# Patient Record
Sex: Female | Born: 1970 | Race: White | Hispanic: No | Smoking: Never smoker
Health system: Southern US, Community
[De-identification: ages and names within clinical notes are randomized; demographics above are authoritative.]

## PROBLEM LIST (undated history)

## (undated) DIAGNOSIS — N189 Chronic kidney disease, unspecified: Secondary | ICD-10-CM

## (undated) DIAGNOSIS — N200 Calculus of kidney: Secondary | ICD-10-CM

## (undated) DIAGNOSIS — E785 Hyperlipidemia, unspecified: Secondary | ICD-10-CM

## (undated) DIAGNOSIS — R7301 Impaired fasting glucose: Secondary | ICD-10-CM

## (undated) DIAGNOSIS — N83209 Unspecified ovarian cyst, unspecified side: Secondary | ICD-10-CM

## (undated) DIAGNOSIS — K219 Gastro-esophageal reflux disease without esophagitis: Secondary | ICD-10-CM

## (undated) DIAGNOSIS — E119 Type 2 diabetes mellitus without complications: Secondary | ICD-10-CM

## (undated) DIAGNOSIS — I10 Essential (primary) hypertension: Secondary | ICD-10-CM

## (undated) DIAGNOSIS — G43109 Migraine with aura, not intractable, without status migrainosus: Secondary | ICD-10-CM

## (undated) DIAGNOSIS — D649 Anemia, unspecified: Secondary | ICD-10-CM

## (undated) DIAGNOSIS — E079 Disorder of thyroid, unspecified: Secondary | ICD-10-CM

## (undated) HISTORY — PX: CHOLECYSTECTOMY: SHX55

## (undated) HISTORY — DX: Chronic kidney disease, unspecified: N18.9

## (undated) HISTORY — DX: Unspecified ovarian cyst, unspecified side: N83.209

## (undated) HISTORY — DX: Disorder of thyroid, unspecified: E07.9

## (undated) HISTORY — DX: Gastro-esophageal reflux disease without esophagitis: K21.9

## (undated) HISTORY — DX: Migraine with aura, not intractable, without status migrainosus: G43.109

## (undated) HISTORY — DX: Calculus of kidney: N20.0

## (undated) HISTORY — DX: Impaired fasting glucose: R73.01

## (undated) HISTORY — DX: Hyperlipidemia, unspecified: E78.5

## (undated) HISTORY — DX: Type 2 diabetes mellitus without complications: E11.9

## (undated) HISTORY — DX: Essential (primary) hypertension: I10

## (undated) HISTORY — DX: Anemia, unspecified: D64.9

---

## 1989-05-22 HISTORY — PX: MOUTH SURGERY: SHX715

## 1997-05-22 HISTORY — PX: BACK SURGERY: SHX140

## 1999-02-17 ENCOUNTER — Emergency Department (HOSPITAL_COMMUNITY): Admission: EM | Admit: 1999-02-17 | Discharge: 1999-02-17 | Payer: Self-pay | Admitting: Emergency Medicine

## 1999-03-07 ENCOUNTER — Encounter: Payer: Self-pay | Admitting: Emergency Medicine

## 1999-03-07 ENCOUNTER — Emergency Department (HOSPITAL_COMMUNITY): Admission: EM | Admit: 1999-03-07 | Discharge: 1999-03-07 | Payer: Self-pay | Admitting: Emergency Medicine

## 2000-12-27 ENCOUNTER — Encounter: Payer: Self-pay | Admitting: Orthopedic Surgery

## 2000-12-27 ENCOUNTER — Encounter: Admission: RE | Admit: 2000-12-27 | Discharge: 2000-12-27 | Payer: Self-pay | Admitting: Orthopedic Surgery

## 2001-01-10 ENCOUNTER — Encounter: Admission: RE | Admit: 2001-01-10 | Discharge: 2001-01-10 | Payer: Self-pay | Admitting: Orthopedic Surgery

## 2001-01-10 ENCOUNTER — Encounter: Payer: Self-pay | Admitting: Orthopedic Surgery

## 2001-01-24 ENCOUNTER — Encounter: Admission: RE | Admit: 2001-01-24 | Discharge: 2001-01-24 | Payer: Self-pay | Admitting: Orthopedic Surgery

## 2001-01-24 ENCOUNTER — Encounter: Payer: Self-pay | Admitting: Orthopedic Surgery

## 2003-04-13 DIAGNOSIS — K219 Gastro-esophageal reflux disease without esophagitis: Secondary | ICD-10-CM | POA: Insufficient documentation

## 2003-08-27 ENCOUNTER — Emergency Department (HOSPITAL_COMMUNITY): Admission: EM | Admit: 2003-08-27 | Discharge: 2003-08-28 | Payer: Self-pay | Admitting: Emergency Medicine

## 2003-09-30 DIAGNOSIS — G43109 Migraine with aura, not intractable, without status migrainosus: Secondary | ICD-10-CM | POA: Insufficient documentation

## 2007-06-23 ENCOUNTER — Emergency Department (HOSPITAL_COMMUNITY): Admission: EM | Admit: 2007-06-23 | Discharge: 2007-06-23 | Payer: Self-pay | Admitting: Emergency Medicine

## 2010-06-22 ENCOUNTER — Emergency Department (HOSPITAL_BASED_OUTPATIENT_CLINIC_OR_DEPARTMENT_OTHER)
Admission: EM | Admit: 2010-06-22 | Discharge: 2010-06-22 | Disposition: A | Discharge status: Home / Self Care | Payer: Federal, State, Local not specified - PPO | Attending: Emergency Medicine | Admitting: Emergency Medicine

## 2010-06-22 ENCOUNTER — Emergency Department (INDEPENDENT_AMBULATORY_CARE_PROVIDER_SITE_OTHER): Payer: Federal, State, Local not specified - PPO

## 2010-06-22 DIAGNOSIS — Z79899 Other long term (current) drug therapy: Secondary | ICD-10-CM | POA: Insufficient documentation

## 2010-06-22 DIAGNOSIS — I10 Essential (primary) hypertension: Secondary | ICD-10-CM | POA: Insufficient documentation

## 2010-06-22 DIAGNOSIS — K219 Gastro-esophageal reflux disease without esophagitis: Secondary | ICD-10-CM | POA: Insufficient documentation

## 2010-06-22 DIAGNOSIS — R109 Unspecified abdominal pain: Secondary | ICD-10-CM | POA: Insufficient documentation

## 2010-06-22 DIAGNOSIS — E039 Hypothyroidism, unspecified: Secondary | ICD-10-CM | POA: Insufficient documentation

## 2010-06-22 DIAGNOSIS — N390 Urinary tract infection, site not specified: Secondary | ICD-10-CM | POA: Insufficient documentation

## 2010-06-22 LAB — DIFFERENTIAL
Basophils Absolute: 0 10*3/uL (ref 0.0–0.1)
Basophils Relative: 0 % (ref 0–1)
Monocytes Absolute: 0.7 10*3/uL (ref 0.1–1.0)
Neutro Abs: 4.7 10*3/uL (ref 1.7–7.7)

## 2010-06-22 LAB — BASIC METABOLIC PANEL
Calcium: 9.2 mg/dL (ref 8.4–10.5)
GFR calc Af Amer: >60 mL/min (ref >60)
GFR calc non Af Amer: >60 mL/min (ref >60)
Potassium: 4 mEq/L (ref 3.5–5.1)
Sodium: 142 mEq/L (ref 135–145)

## 2010-06-22 LAB — URINALYSIS, ROUTINE W REFLEX MICROSCOPIC
Bilirubin Urine: NEGATIVE
Ketones, ur: NEGATIVE mg/dL
Nitrite: NEGATIVE
Protein, ur: NEGATIVE mg/dL
Specific Gravity, Urine: 1.029 (ref 1.005–1.030)
Urine Glucose, Fasting: NEGATIVE mg/dL
Urobilinogen, UA: 0.2 mg/dL (ref 0.0–1.0)
pH: 5.5 (ref 5.0–8.0)

## 2010-06-22 LAB — CBC
Hemoglobin: 12.4 g/dL (ref 12.0–15.0)
MCHC: 34.6 g/dL (ref 30.0–36.0)
RDW: 14.2 % (ref 11.5–15.5)
WBC: 8.6 10*3/uL (ref 4.0–10.5)

## 2010-06-22 LAB — URINE MICROSCOPIC-ADD ON

## 2011-02-20 LAB — HM PAP SMEAR

## 2012-11-12 ENCOUNTER — Other Ambulatory Visit: Payer: Self-pay | Admitting: *Deleted

## 2012-11-14 ENCOUNTER — Telehealth: Payer: Self-pay

## 2012-11-14 NOTE — Telephone Encounter (Signed)
Per Elease Hashimoto a prescription has been called in to [facility] on [address]for One Touch Ultra Blue Test Strips with 11 refills. LB

## 2012-12-10 ENCOUNTER — Telehealth: Payer: Self-pay | Admitting: *Deleted

## 2012-12-10 MED ORDER — LEVOTHYROXINE SODIUM 150 MCG PO TABS: 150.0000 ug | ORAL_TABLET | Freq: Every day | ORAL | Status: DC

## 2012-12-10 NOTE — Telephone Encounter (Signed)
Please refill her levothyroxine 150 mcg (0.15 mg) oral tablet for 30 days.  She is scheduled for labs and an office visit already. KL

## 2012-12-10 NOTE — Telephone Encounter (Signed)
This was sent to her pharmacy on [address] [facility] [address]) [facility]

## 2012-12-19 ENCOUNTER — Other Ambulatory Visit: Payer: Self-pay | Admitting: *Deleted

## 2012-12-19 DIAGNOSIS — E785 Hyperlipidemia, unspecified: Secondary | ICD-10-CM

## 2012-12-19 DIAGNOSIS — R5381 Other malaise: Secondary | ICD-10-CM

## 2012-12-19 DIAGNOSIS — E039 Hypothyroidism, unspecified: Secondary | ICD-10-CM

## 2012-12-19 DIAGNOSIS — E559 Vitamin D deficiency, unspecified: Secondary | ICD-10-CM

## 2012-12-23 ENCOUNTER — Other Ambulatory Visit: Payer: Federal, State, Local not specified - PPO

## 2012-12-23 LAB — COMPLETE METABOLIC PANEL WITH GFR
ALT: 13 U/L (ref 0–35)
AST: 16 U/L (ref 0–37)
Albumin: 4.4 g/dL (ref 3.5–5.2)
Alkaline Phosphatase: 64 U/L (ref 39–117)
BUN: 19 mg/dL (ref 6–23)
CO2: 26 mEq/L (ref 19–32)
Calcium: 9.3 mg/dL (ref 8.4–10.5)
Chloride: 104 mEq/L (ref 96–112)
Creat: 0.64 mg/dL (ref 0.50–1.10)
GFR, Est African American: >89 mL/min
GFR, Est Non African American: >89 mL/min
Glucose, Bld: 98 mg/dL (ref 70–99)
Potassium: 4.7 mEq/L (ref 3.5–5.3)
Sodium: 139 mEq/L (ref 135–145)
Total Bilirubin: 0.4 mg/dL (ref 0.3–1.2)
Total Protein: 7 g/dL (ref 6.0–8.3)

## 2012-12-23 LAB — TSH: TSH: 0.037 u[IU]/mL — ABNORMAL LOW (ref 0.350–4.500)

## 2012-12-23 LAB — CBC WITH DIFFERENTIAL/PLATELET
Basophils Absolute: 0 10*3/uL (ref 0.0–0.1)
Basophils Relative: 0 % (ref 0–1)
Eosinophils Absolute: 0.2 10*3/uL (ref 0.0–0.7)
Eosinophils Relative: 3 % (ref 0–5)
HCT: 41 % (ref 36.0–46.0)
Hemoglobin: 13.5 g/dL (ref 12.0–15.0)
Lymphocytes Relative: 26 % (ref 12–46)
Lymphs Abs: 2 10*3/uL (ref 0.7–4.0)
MCH: 24.7 pg — ABNORMAL LOW (ref 26.0–34.0)
MCHC: 32.9 g/dL (ref 30.0–36.0)
MCV: 75.1 fL — ABNORMAL LOW (ref 78.0–100.0)
Monocytes Absolute: 0.6 10*3/uL (ref 0.1–1.0)
Monocytes Relative: 8 % (ref 3–12)
Neutro Abs: 5 10*3/uL (ref 1.7–7.7)
Neutrophils Relative %: 63 % (ref 43–77)
Platelets: 282 10*3/uL (ref 150–400)
RBC: 5.46 MIL/uL — ABNORMAL HIGH (ref 3.87–5.11)
RDW: 16 % — ABNORMAL HIGH (ref 11.5–15.5)
WBC: 7.9 10*3/uL (ref 4.0–10.5)

## 2012-12-23 LAB — LIPID PANEL
Cholesterol: 155 mg/dL (ref 0–200)
HDL: 40 mg/dL (ref >39)
LDL Cholesterol: 68 mg/dL (ref 0–99)
Total CHOL/HDL Ratio: 3.9 Ratio
Triglycerides: 235 mg/dL — ABNORMAL HIGH (ref <150)
VLDL: 47 mg/dL — ABNORMAL HIGH (ref 0–40)

## 2012-12-23 LAB — T4, FREE: Free T4: 1.42 ng/dL (ref 0.80–1.80)

## 2012-12-24 LAB — VITAMIN D 25 HYDROXY (VIT D DEFICIENCY, FRACTURES): Vit D, 25-Hydroxy: 61 ng/mL (ref 30–89)

## 2013-01-02 ENCOUNTER — Encounter: Payer: Self-pay | Admitting: Family Medicine

## 2013-01-02 ENCOUNTER — Ambulatory Visit (INDEPENDENT_AMBULATORY_CARE_PROVIDER_SITE_OTHER): Payer: Federal, State, Local not specified - PPO | Admitting: Family Medicine

## 2013-01-02 VITALS — BP 121/71 | HR 98 | Resp 16 | Ht 59.0 in | Wt 188.0 lb

## 2013-01-02 DIAGNOSIS — E119 Type 2 diabetes mellitus without complications: Secondary | ICD-10-CM

## 2013-01-02 DIAGNOSIS — I10 Essential (primary) hypertension: Secondary | ICD-10-CM

## 2013-01-02 DIAGNOSIS — E039 Hypothyroidism, unspecified: Secondary | ICD-10-CM

## 2013-01-02 DIAGNOSIS — E785 Hyperlipidemia, unspecified: Secondary | ICD-10-CM

## 2013-01-02 LAB — POCT GLYCOSYLATED HEMOGLOBIN (HGB A1C): Hemoglobin A1C: 6.4

## 2013-01-02 MED ORDER — CYCLOBENZAPRINE HCL 10 MG PO TABS: 10.0000 mg | ORAL_TABLET | Freq: Every evening | ORAL | Status: DC | PRN

## 2013-01-02 MED ORDER — LEVONORGEST-ETH ESTRAD 91-DAY 0.15-0.03 &0.01 MG PO TABS: 1.0000 | ORAL_TABLET | Freq: Every day | ORAL | Status: AC

## 2013-01-02 MED ORDER — ROSUVASTATIN CALCIUM 40 MG PO TABS: ORAL_TABLET | ORAL | Status: AC

## 2013-01-02 MED ORDER — CANAGLIFLOZIN 300 MG PO TABS: 300.0000 mg | ORAL_TABLET | Freq: Every day | ORAL | Status: DC

## 2013-01-02 MED ORDER — METOPROLOL SUCCINATE ER 50 MG PO TB24: ORAL_TABLET | ORAL | Status: DC

## 2013-01-02 MED ORDER — RIZATRIPTAN BENZOATE 10 MG PO TBDP: 10.0000 mg | ORAL_TABLET | ORAL | Status: AC | PRN

## 2013-01-02 MED ORDER — AMLODIPINE-VALSARTAN-HCTZ 5-160-25 MG PO TABS: 1.0000 | ORAL_TABLET | Freq: Every morning | ORAL | Status: AC

## 2013-01-02 MED ORDER — LEVOTHYROXINE SODIUM 150 MCG PO TABS: 150.0000 ug | ORAL_TABLET | Freq: Every day | ORAL | Status: AC

## 2013-01-02 MED ORDER — SITAGLIP PHOS-METFORMIN HCL ER 100-1000 MG PO TB24: 1.0000 | ORAL_TABLET | Freq: Every morning | ORAL | Status: DC

## 2013-01-02 MED ORDER — CITALOPRAM HYDROBROMIDE 20 MG PO TABS: 20.0000 mg | ORAL_TABLET | Freq: Every day | ORAL | Status: DC

## 2013-01-02 NOTE — Progress Notes (Signed)
  Subjective:    Patient ID: Morgan Barron, female    DOB: May 02, 1971, 42 y.o.   MRN: [id number]  HPI  Morgan Barron is here today to go over her most recent lab results, get medication refills and to discus the following conditions:      1)  Type II DM:  She is doing well on the combination of Invokana and Janumet.  She checks her blood glucose at home several times per week which has been running usually under 120 mg/dL.  2)  Hypothyroidism:  She is doing well on levothyroxine 150 mcg.  She needs a refill on it.   3)  Vaginal Irritation:  She needs a refill on her nystatin-triamcinolone.  She uses this cream whenever she gets a vaginal irritation due to her Invokana.     Review of Systems  Constitutional: Positive for unexpected weight change.  HENT: Negative.   Eyes: Negative.   Respiratory: Negative.   Cardiovascular: Negative.   Gastrointestinal: Negative.   Endocrine: Negative.   Genitourinary: Negative.   Musculoskeletal: Negative.   Skin: Negative.   Allergic/Immunologic: Negative.   Neurological: Negative.   Hematological: Negative.   Psychiatric/Behavioral: Negative.      Past Medical History  Diagnosis Date  . Migraine with aura, without mention of intractable migraine without mention of status migrainosus   . Hypertension   . Hyperlipidemia   . Chronic kidney disease     Kidney Stones  . Thyroid disease     Hypothyroidism  . GERD (gastroesophageal reflux disease)   . Anemia   . Ovarian cyst     Left Ovary  . Impaired fasting glucose     Family History  Problem Relation Age of Onset  . Diabetes Mother   . Hypertension Mother   . Heart disease Mother   . Renal Disease Mother     History   Social History Narrative   Marital Status:  Single   Living Situation: Lives with daughter Trula Ore), Father, Brother   Occupation: [employer]   Education:  Engineer, agricultural   Tobacco Use/Exposure: None   Alcohol Use:  Occasional   Drug Use:   None   Diet:  Healthy   Exercise:  [employer]                  Objective:   Physical Exam  Vitals reviewed. Constitutional: She is oriented to person, place, and time.  Eyes: Conjunctivae are normal. No scleral icterus.  Neck: Neck supple. No thyromegaly present.  Cardiovascular: Normal rate, regular rhythm and normal heart sounds.   Pulmonary/Chest: Effort normal and breath sounds normal.  Musculoskeletal: She exhibits no edema and no tenderness.  Lymphadenopathy:    She has no cervical adenopathy.  Neurological: She is alert and oriented to person, place, and time.  Skin: Skin is warm and dry.  Psychiatric: She has a normal mood and affect. Her behavior is normal. Judgment and thought content normal.      Assessment & Plan:

## 2013-01-22 ENCOUNTER — Ambulatory Visit (INDEPENDENT_AMBULATORY_CARE_PROVIDER_SITE_OTHER): Payer: Federal, State, Local not specified - PPO | Admitting: Family Medicine

## 2013-01-22 ENCOUNTER — Encounter: Payer: Self-pay | Admitting: Family Medicine

## 2013-01-22 VITALS — BP 121/81 | HR 92 | Wt 192.0 lb

## 2013-01-22 DIAGNOSIS — Z23 Encounter for immunization: Secondary | ICD-10-CM | POA: Insufficient documentation

## 2013-01-22 DIAGNOSIS — N924 Excessive bleeding in the premenopausal period: Secondary | ICD-10-CM

## 2013-01-22 MED ORDER — ESTRADIOL VALERATE-DIENOGEST 3/2-2/2-3/1 MG PO TABS: 1.0000 | ORAL_TABLET | Freq: Every day | ORAL | Status: DC

## 2013-01-22 MED ORDER — DROSPIRENONE-ETHINYL ESTRADIOL 3-0.02 MG PO TABS: ORAL_TABLET | ORAL | Status: DC

## 2013-01-22 NOTE — Assessment & Plan Note (Signed)
The patient confirmed that they are not allergic to eggs and have never had a bad reaction with the flu shot in the past.  The vaccination was given without difficulty.   

## 2013-01-22 NOTE — Progress Notes (Signed)
  Subjective:    Patient ID: Morgan Barron, female    DOB: 1971/04/26, 42 y.o.   MRN: [id number]  HPI  Morgan Barron is here today to discuss some changes she has noticed in her period.  She has taken [facility] for several years and has done well on it in the past.  In February 2014, her period was much heavier and long and she passed large blood clots.  She has been told in the past that she has had ovarian cysts and she is wondering if this is causing her to have these changes in her bleeding.  She says that she has not missed any pills.     Review of Systems  Constitutional: Positive for fatigue.  HENT: Negative.   Eyes: Negative.   Respiratory: Negative.   Cardiovascular: Negative.   Gastrointestinal: Negative.   Endocrine: Negative.   Genitourinary: Positive for menstrual problem.       Left ovary pain  Musculoskeletal: Negative.   Skin: Negative.   Allergic/Immunologic: Negative.   Neurological: Negative.   Hematological: Negative.   Psychiatric/Behavioral: Negative.     Past Medical History  Diagnosis Date  . Migraine with aura, without mention of intractable migraine without mention of status migrainosus   . Hypertension   . Hyperlipidemia   . Chronic kidney disease     Kidney Stones  . Thyroid disease     Hypothyroidism  . GERD (gastroesophageal reflux disease)   . Anemia   . Ovarian cyst     Left Ovary  . Impaired fasting glucose      Family History  Problem Relation Age of Onset  . Diabetes Mother   . Hypertension Mother   . Heart disease Mother   . Renal Disease Mother     History   Social History Narrative   Marital Status:  Single   Living Situation: Lives with father and brother   Occupation: Actuary. [employer]   Education:  Engineer, agricultural   Tobacco Use/Exposure: None   Alcohol Use:  Occasional   Drug Use:  None   Diet:  Healthy   Exercise:  [employer]                     Objective:   Physical Exam  Vitals  reviewed. Constitutional: She is oriented to person, place, and time. She appears well-developed and well-nourished. No distress.  Cardiovascular: Normal rate and regular rhythm.   Pulmonary/Chest: Effort normal.  Abdominal: Soft. She exhibits no distension and no mass. Tenderness: Mild discomfort in LLQ   Neurological: She is alert and oriented to person, place, and time.  Skin: Skin is warm and dry.  Psychiatric: She has a normal mood and affect.          Assessment & Plan:

## 2013-01-22 NOTE — Assessment & Plan Note (Signed)
We discussed her heavy periods and various options.  She was given a couple of other pills to try.  If she decides to stay on Seasonique, she'll try taking some Ibuprofen a couple of days before her period starts.

## 2013-01-22 NOTE — Patient Instructions (Addendum)
1)  Heavy Periods - You can continue on the [facility] knowing that you are normal and try the 800 Ibuprofen 2 times a day for 2-3 days prior to period or you can take the [facility] for another week then stop and wait for period to start.  You can then try the N[place]zia or wait until the next Sunday to start on the YAZ.    Menorrhagia Dysfunctional uterine bleeding is different from a normal menstrual period. When periods are heavy or there is more bleeding than is usual for you, it is called menorrhagia. It may be caused by hormonal imbalance, or physical, metabolic, or other problems. Examination is necessary in order that your caregiver may treat treatable causes. If this is a continuing problem, a D&C may be needed. That means that the cervix (the opening of the uterus or womb) is dilated (stretched larger) and the lining of the uterus is scraped out. The tissue scraped out is then examined under a microscope by a specialist (pathologist) to make sure there is nothing of concern that needs further or more extensive treatment. HOME CARE INSTRUCTIONS   If medications were prescribed, take exactly as directed. Do not change or switch medications without consulting your caregiver.  Long term heavy bleeding may result in iron deficiency. Your caregiver may have prescribed iron pills. They help replace the iron your body lost from heavy bleeding. Take exactly as directed. Iron may cause constipation. If this becomes a problem, increase the bran, fruits, and roughage in your diet.  Do not take aspirin or medicines that contain aspirin one week before or during your menstrual period. Aspirin may make the bleeding worse.  If you need to change your sanitary pad or tampon more than once every 2 hours, stay in bed and rest as much as possible until the bleeding stops.  Eat well-balanced meals. Eat foods high in iron. Examples are leafy green vegetables, meat, liver, eggs, and whole grain breads and cereals. Do  not try to lose weight until the abnormal bleeding has stopped and your blood iron level is back to normal. SEEK MEDICAL CARE IF:   You need to change your sanitary pad or tampon more than once an hour.  You develop nausea (feeling sick to your stomach) and vomiting, dizziness, or diarrhea while you are taking your medicine.  You have any problems that may be related to the medicine you are taking. SEEK IMMEDIATE MEDICAL CARE IF:   You have a fever.  You develop chills.  You develop severe bleeding or start to pass blood clots.  You feel dizzy or faint. MAKE SURE YOU:   Understand these instructions.  Will watch your condition.  Will get help right away if you are not doing well or get worse. Document Released: 05/08/2005 Document Revised: 07/31/2011 Document Reviewed: 12/27/2007 [facility] Patient Information 2014 [place], [place].

## 2013-02-16 ENCOUNTER — Encounter: Payer: Self-pay | Admitting: Family Medicine

## 2013-02-16 DIAGNOSIS — E119 Type 2 diabetes mellitus without complications: Secondary | ICD-10-CM | POA: Insufficient documentation

## 2013-02-16 DIAGNOSIS — I10 Essential (primary) hypertension: Secondary | ICD-10-CM | POA: Insufficient documentation

## 2013-02-16 DIAGNOSIS — E039 Hypothyroidism, unspecified: Secondary | ICD-10-CM | POA: Insufficient documentation

## 2013-02-16 DIAGNOSIS — E785 Hyperlipidemia, unspecified: Secondary | ICD-10-CM | POA: Insufficient documentation

## 2013-02-16 DIAGNOSIS — Z309 Encounter for contraceptive management, unspecified: Secondary | ICD-10-CM | POA: Insufficient documentation

## 2013-02-16 NOTE — Assessment & Plan Note (Signed)
Refilled her Janumet and Invokana.

## 2013-02-16 NOTE — Assessment & Plan Note (Signed)
Refilled her Crestor.   

## 2013-02-16 NOTE — Assessment & Plan Note (Signed)
Refilled her levothyroxine.   

## 2013-02-16 NOTE — Assessment & Plan Note (Deleted)
She was given prescriptions for YAZ and Suann Larry to try.  She will compare the cost vs how they work for her.

## 2013-02-16 NOTE — Assessment & Plan Note (Addendum)
Refilled her Exforge HCT and Toprol XL.

## 2013-03-03 LAB — HM MAMMOGRAPHY

## 2013-03-04 ENCOUNTER — Encounter: Payer: Self-pay | Admitting: *Deleted

## 2013-03-05 ENCOUNTER — Encounter: Payer: Self-pay | Admitting: Family Medicine

## 2013-03-05 ENCOUNTER — Ambulatory Visit (INDEPENDENT_AMBULATORY_CARE_PROVIDER_SITE_OTHER): Payer: Federal, State, Local not specified - PPO | Admitting: Family Medicine

## 2013-03-05 VITALS — BP 119/78 | HR 105 | Resp 16 | Ht 59.75 in | Wt 190.0 lb

## 2013-03-05 DIAGNOSIS — E119 Type 2 diabetes mellitus without complications: Secondary | ICD-10-CM

## 2013-03-05 DIAGNOSIS — L738 Other specified follicular disorders: Secondary | ICD-10-CM

## 2013-03-05 DIAGNOSIS — Z0000 Encounter for general adult medical examination without abnormal findings: Secondary | ICD-10-CM | POA: Insufficient documentation

## 2013-03-05 DIAGNOSIS — B373 Candidiasis of vulva and vagina: Secondary | ICD-10-CM

## 2013-03-05 DIAGNOSIS — B3731 Acute candidiasis of vulva and vagina: Secondary | ICD-10-CM | POA: Insufficient documentation

## 2013-03-05 DIAGNOSIS — L739 Follicular disorder, unspecified: Secondary | ICD-10-CM | POA: Insufficient documentation

## 2013-03-05 LAB — POCT URINALYSIS DIPSTICK
Bilirubin, UA: NEGATIVE
Blood, UA: NEGATIVE
Ketones, UA: NEGATIVE
Leukocytes, UA: NEGATIVE
Nitrite, UA: NEGATIVE
Protein, UA: NEGATIVE
Spec Grav, UA: 1.02
Urobilinogen, UA: NEGATIVE
pH, UA: 5

## 2013-03-05 MED ORDER — NYSTATIN-TRIAMCINOLONE 100000-0.1 UNIT/GM-% EX CREA: 1.0000 "application " | TOPICAL_CREAM | Freq: Two times a day (BID) | CUTANEOUS | Status: AC

## 2013-03-05 MED ORDER — MUPIROCIN CALCIUM 2 % EX CREA: TOPICAL_CREAM | CUTANEOUS | Status: DC

## 2013-03-05 NOTE — Patient Instructions (Addendum)
Preventive Care for Adults, Female A healthy lifestyle and preventive care can promote health and wellness. Preventive health guidelines for women include the following key practices.  A routine yearly physical is a good way to check with your caregiver about your health and preventive screening. It is a chance to share any concerns and updates on your health, and to receive a thorough exam.  Visit your dentist for a routine exam and preventive care every 6 months. Brush your teeth twice a day and floss once a day. Good oral hygiene prevents tooth decay and gum disease.  The frequency of eye exams is based on your age, health, family medical history, use of contact lenses, and other factors. Follow your caregiver's recommendations for frequency of eye exams.  Eat a healthy diet. Foods like vegetables, fruits, whole grains, low-fat dairy products, and lean protein foods contain the nutrients you need without too many calories. Decrease your intake of foods high in solid fats, added sugars, and salt. Eat the right amount of calories for you.Get information about a proper diet from your caregiver, if necessary.  Regular physical exercise is one of the most important things you can do for your health. Most adults should get at least 150 minutes of moderate-intensity exercise (any activity that increases your heart rate and causes you to sweat) each week. In addition, most adults need muscle-strengthening exercises on 2 or more days a week.  Maintain a healthy weight. The body mass index (BMI) is a screening tool to identify possible weight problems. It provides an estimate of body fat based on height and weight. Your caregiver can help determine your BMI, and can help you achieve or maintain a healthy weight.For adults 20 years and older:  A BMI below 18.5 is considered underweight.  A BMI of 18.5 to 24.9 is normal.  A BMI of 25 to 29.9 is considered overweight.  A BMI of 30 and above is  considered obese.  Maintain normal blood lipids and cholesterol levels by exercising and minimizing your intake of saturated fat. Eat a balanced diet with plenty of fruit and vegetables. Blood tests for lipids and cholesterol should begin at age 20 and be repeated every 5 years. If your lipid or cholesterol levels are high, you are over 50, or you are at high risk for heart disease, you may need your cholesterol levels checked more frequently.Ongoing high lipid and cholesterol levels should be treated with medicines if diet and exercise are not effective.  If you smoke, find out from your caregiver how to quit. If you do not use tobacco, do not start.  If you are pregnant, do not drink alcohol. If you are breastfeeding, be very cautious about drinking alcohol. If you are not pregnant and choose to drink alcohol, do not exceed 1 drink per day. One drink is considered to be 12 ounces (355 mL) of beer, 5 ounces (148 mL) of wine, or 1.5 ounces (44 mL) of liquor.  Avoid use of street drugs. Do not share needles with anyone. Ask for help if you need support or instructions about stopping the use of drugs.  High blood pressure causes heart disease and increases the risk of stroke. Your blood pressure should be checked at least every 1 to 2 years. Ongoing high blood pressure should be treated with medicines if weight loss and exercise are not effective.  If you are 55 to 44 years old, ask your caregiver if you should take aspirin to prevent strokes.  Diabetes   screening involves taking a blood sample to check your fasting blood sugar level. This should be done once every 3 years, after age 45, if you are within normal weight and without risk factors for diabetes. Testing should be considered at a younger age or be carried out more frequently if you are overweight and have at least 1 risk factor for diabetes.  Breast cancer screening is essential preventive care for women. You should practice "breast  self-awareness." This means understanding the normal appearance and feel of your breasts and may include breast self-examination. Any changes detected, no matter how small, should be reported to a caregiver. Women in their 20s and 30s should have a clinical breast exam (CBE) by a caregiver as part of a regular health exam every 1 to 3 years. After age 40, women should have a CBE every year. Starting at age 40, women should consider having a mammography (breast X-ray test) every year. Women who have a family history of breast cancer should talk to their caregiver about genetic screening. Women at a high risk of breast cancer should talk to their caregivers about having magnetic resonance imaging (MRI) and a mammography every year.  The Pap test is a screening test for cervical cancer. A Pap test can show cell changes on the cervix that might become cervical cancer if left untreated. A Pap test is a procedure in which cells are obtained and examined from the lower end of the uterus (cervix).  Women should have a Pap test starting at age 21.  Between ages 21 and 29, Pap tests should be repeated every 2 years.  Beginning at age 30, you should have a Pap test every 3 years as long as the past 3 Pap tests have been normal.  Some women have medical problems that increase the chance of getting cervical cancer. Talk to your caregiver about these problems. It is especially important to talk to your caregiver if a new problem develops soon after your last Pap test. In these cases, your caregiver may recommend more frequent screening and Pap tests.  The above recommendations are the same for women who have or have not gotten the vaccine for human papillomavirus (HPV).  If you had a hysterectomy for a problem that was not cancer or a condition that could lead to cancer, then you no longer need Pap tests. Even if you no longer need a Pap test, a regular exam is a good idea to make sure no other problems are  starting.  If you are between ages 65 and 70, and you have had normal Pap tests going back 10 years, you no longer need Pap tests. Even if you no longer need a Pap test, a regular exam is a good idea to make sure no other problems are starting.  If you have had past treatment for cervical cancer or a condition that could lead to cancer, you need Pap tests and screening for cancer for at least 20 years after your treatment.  If Pap tests have been discontinued, risk factors (such as a new sexual partner) need to be reassessed to determine if screening should be resumed.  The HPV test is an additional test that may be used for cervical cancer screening. The HPV test looks for the virus that can cause the cell changes on the cervix. The cells collected during the Pap test can be tested for HPV. The HPV test could be used to screen women aged 30 years and older, and should   be used in women of any age who have unclear Pap test results. After the age of 30, women should have HPV testing at the same frequency as a Pap test.  Colorectal cancer can be detected and often prevented. Most routine colorectal cancer screening begins at the age of 50 and continues through age 75. However, your caregiver may recommend screening at an earlier age if you have risk factors for colon cancer. On a yearly basis, your caregiver may provide home test kits to check for hidden blood in the stool. Use of a small camera at the end of a tube, to directly examine the colon (sigmoidoscopy or colonoscopy), can detect the earliest forms of colorectal cancer. Talk to your caregiver about this at age 50, when routine screening begins. Direct examination of the colon should be repeated every 5 to 10 years through age 75, unless early forms of pre-cancerous polyps or small growths are found.  Hepatitis C blood testing is recommended for all people born from 1945 through 1965 and any individual with known risks for hepatitis C.  Practice  safe sex. Use condoms and avoid high-risk sexual practices to reduce the spread of sexually transmitted infections (STIs). STIs include gonorrhea, chlamydia, syphilis, trichomonas, herpes, HPV, and human immunodeficiency virus (HIV). Herpes, HIV, and HPV are viral illnesses that have no cure. They can result in disability, cancer, and death. Sexually active women aged 25 and younger should be checked for chlamydia. Older women with new or multiple partners should also be tested for chlamydia. Testing for other STIs is recommended if you are sexually active and at increased risk.  Osteoporosis is a disease in which the bones lose minerals and strength with aging. This can result in serious bone fractures. The risk of osteoporosis can be identified using a bone density scan. Women ages 65 and over and women at risk for fractures or osteoporosis should discuss screening with their caregivers. Ask your caregiver whether you should take a calcium supplement or vitamin D to reduce the rate of osteoporosis.  Menopause can be associated with physical symptoms and risks. Hormone replacement therapy is available to decrease symptoms and risks. You should talk to your caregiver about whether hormone replacement therapy is right for you.  Use sunscreen with sun protection factor (SPF) of 30 or more. Apply sunscreen liberally and repeatedly throughout the day. You should seek shade when your shadow is shorter than you. Protect yourself by wearing long sleeves, pants, a wide-brimmed hat, and sunglasses year round, whenever you are outdoors.  Once a month, do a whole body skin exam, using a mirror to look at the skin on your back. Notify your caregiver of new moles, moles that have irregular borders, moles that are larger than a pencil eraser, or moles that have changed in shape or color.  Stay current with required immunizations.  Influenza. You need a dose every fall (or winter). The composition of the flu vaccine  changes each year, so being vaccinated once is not enough.  Pneumococcal polysaccharide. You need 1 to 2 doses if you smoke cigarettes or if you have certain chronic medical conditions. You need 1 dose at age 65 (or older) if you have never been vaccinated.  Tetanus, diphtheria, pertussis (Tdap, Td). Get 1 dose of Tdap vaccine if you are younger than age 65, are over 65 and have contact with an infant, are a healthcare worker, are pregnant, or simply want to be protected from whooping cough. After that, you need a Td   booster dose every 10 years. Consult your caregiver if you have not had at least 3 tetanus and diphtheria-containing shots sometime in your life or have a deep or dirty wound.  HPV. You need this vaccine if you are a woman age 26 or younger. The vaccine is given in 3 doses over 6 months.  Measles, mumps, rubella (MMR). You need at least 1 dose of MMR if you were born in 1957 or later. You may also need a second dose.  Meningococcal. If you are age 19 to 21 and a first-year college student living in a residence hall, or have one of several medical conditions, you need to get vaccinated against meningococcal disease. You may also need additional booster doses.  Zoster (shingles). If you are age 60 or older, you should get this vaccine.  Varicella (chickenpox). If you have never had chickenpox or you were vaccinated but received only 1 dose, talk to your caregiver to find out if you need this vaccine.  Hepatitis A. You need this vaccine if you have a specific risk factor for hepatitis A virus infection or you simply wish to be protected from this disease. The vaccine is usually given as 2 doses, 6 to 18 months apart.  Hepatitis B. You need this vaccine if you have a specific risk factor for hepatitis B virus infection or you simply wish to be protected from this disease. The vaccine is given in 3 doses, usually over 6 months. Preventive Services / Frequency Ages 19 to 39  Blood  pressure check.** / Every 1 to 2 years.  Lipid and cholesterol check.** / Every 5 years beginning at age 20.  Clinical breast exam.** / Every 3 years for women in their 20s and 30s.  Pap test.** / Every 2 years from ages 21 through 29. Every 3 years starting at age 30 through age 65 or 70 with a history of 3 consecutive normal Pap tests.  HPV screening.** / Every 3 years from ages 30 through ages 65 to 70 with a history of 3 consecutive normal Pap tests.  Hepatitis C blood test.** / For any individual with known risks for hepatitis C.  Skin self-exam. / Monthly.  Influenza immunization.** / Every year.  Pneumococcal polysaccharide immunization.** / 1 to 2 doses if you smoke cigarettes or if you have certain chronic medical conditions.  Tetanus, diphtheria, pertussis (Tdap, Td) immunization. / A one-time dose of Tdap vaccine. After that, you need a Td booster dose every 10 years.  HPV immunization. / 3 doses over 6 months, if you are 26 and younger.  Measles, mumps, rubella (MMR) immunization. / You need at least 1 dose of MMR if you were born in 1957 or later. You may also need a second dose.  Meningococcal immunization. / 1 dose if you are age 19 to 21 and a first-year college student living in a residence hall, or have one of several medical conditions, you need to get vaccinated against meningococcal disease. You may also need additional booster doses.  Varicella immunization.** / Consult your caregiver.  Hepatitis A immunization.** / Consult your caregiver. 2 doses, 6 to 18 months apart.  Hepatitis B immunization.** / Consult your caregiver. 3 doses usually over 6 months. Ages 40 to 64  Blood pressure check.** / Every 1 to 2 years.  Lipid and cholesterol check.** / Every 5 years beginning at age 20.  Clinical breast exam.** / Every year after age 40.  Mammogram.** / Every year beginning at age 40   and continuing for as long as you are in good health. Consult with your  caregiver.  Pap test.** / Every 3 years starting at age 30 through age 65 or 70 with a history of 3 consecutive normal Pap tests.  HPV screening.** / Every 3 years from ages 30 through ages 65 to 70 with a history of 3 consecutive normal Pap tests.  Fecal occult blood test (FOBT) of stool. / Every year beginning at age 50 and continuing until age 75. You may not need to do this test if you get a colonoscopy every 10 years.  Flexible sigmoidoscopy or colonoscopy.** / Every 5 years for a flexible sigmoidoscopy or every 10 years for a colonoscopy beginning at age 50 and continuing until age 75.  Hepatitis C blood test.** / For all people born from 1945 through 1965 and any individual with known risks for hepatitis C.  Skin self-exam. / Monthly.  Influenza immunization.** / Every year.  Pneumococcal polysaccharide immunization.** / 1 to 2 doses if you smoke cigarettes or if you have certain chronic medical conditions.  Tetanus, diphtheria, pertussis (Tdap, Td) immunization.** / A one-time dose of Tdap vaccine. After that, you need a Td booster dose every 10 years.  Measles, mumps, rubella (MMR) immunization. / You need at least 1 dose of MMR if you were born in 1957 or later. You may also need a second dose.  Varicella immunization.** / Consult your caregiver.  Meningococcal immunization.** / Consult your caregiver.  Hepatitis A immunization.** / Consult your caregiver. 2 doses, 6 to 18 months apart.  Hepatitis B immunization.** / Consult your caregiver. 3 doses, usually over 6 months. Ages 65 and over  Blood pressure check.** / Every 1 to 2 years.  Lipid and cholesterol check.** / Every 5 years beginning at age 20.  Clinical breast exam.** / Every year after age 40.  Mammogram.** / Every year beginning at age 40 and continuing for as long as you are in good health. Consult with your caregiver.  Pap test.** / Every 3 years starting at age 30 through age 65 or 70 with a 3  consecutive normal Pap tests. Testing can be stopped between 65 and 70 with 3 consecutive normal Pap tests and no abnormal Pap or HPV tests in the past 10 years.  HPV screening.** / Every 3 years from ages 30 through ages 65 or 70 with a history of 3 consecutive normal Pap tests. Testing can be stopped between 65 and 70 with 3 consecutive normal Pap tests and no abnormal Pap or HPV tests in the past 10 years.  Fecal occult blood test (FOBT) of stool. / Every year beginning at age 50 and continuing until age 75. You may not need to do this test if you get a colonoscopy every 10 years.  Flexible sigmoidoscopy or colonoscopy.** / Every 5 years for a flexible sigmoidoscopy or every 10 years for a colonoscopy beginning at age 50 and continuing until age 75.  Hepatitis C blood test.** / For all people born from 1945 through 1965 and any individual with known risks for hepatitis C.  Osteoporosis screening.** / A one-time screening for women ages 65 and over and women at risk for fractures or osteoporosis.  Skin self-exam. / Monthly.  Influenza immunization.** / Every year.  Pneumococcal polysaccharide immunization.** / 1 dose at age 65 (or older) if you have never been vaccinated.  Tetanus, diphtheria, pertussis (Tdap, Td) immunization. / A one-time dose of Tdap vaccine if you are over   65 and have contact with an infant, are a healthcare worker, or simply want to be protected from whooping cough. After that, you need a Td booster dose every 10 years.  Varicella immunization.** / Consult your caregiver.  Meningococcal immunization.** / Consult your caregiver.  Hepatitis A immunization.** / Consult your caregiver. 2 doses, 6 to 18 months apart.  Hepatitis B immunization.** / Check with your caregiver. 3 doses, usually over 6 months. ** Family history and personal history of risk and conditions may change your caregiver's recommendations. Document Released: 07/04/2001 Document Revised: 07/31/2011  Document Reviewed: 10/03/2010 [employer] Patient Information 2014 [employer].  

## 2013-03-05 NOTE — Progress Notes (Signed)
Subjective:    Patient ID: Unknown Morgan Barron, female    DOB: [birth year], 42 y.o.   MRN: [id number]  HPI  Morgan Barron is here today for her annual CPE.  She has done well since her last office visit.  Her only complaint today is some irritation in the genital area.      Review of Systems  Constitutional: Negative.   HENT: Negative.   Eyes: Negative.   Respiratory: Negative.   Cardiovascular: Negative.   Gastrointestinal: Negative.   Endocrine: Negative.   Genitourinary: Negative.   Musculoskeletal: Negative.   Skin:       Genital Irritation   Allergic/Immunologic: Negative.   Neurological: Negative.   Hematological: Negative.   Psychiatric/Behavioral: Negative.      Past Medical History  Diagnosis Date  . Migraine with aura, without mention of intractable migraine without mention of status migrainosus   . Hypertension   . Hyperlipidemia   . Chronic kidney disease     Kidney Stones  . Thyroid disease     Hypothyroidism  . GERD (gastroesophageal reflux disease)   . Anemia   . Ovarian cyst     Left Ovary  . Impaired fasting glucose      Family History  Problem Relation Age of Onset  . Diabetes Mother   . Hypertension Mother   . Heart disease Mother   . Renal Disease Mother     History   Social History Narrative   Marital Status:  Single   Living Situation: Lives with father and brother   Occupation: Actuary. [employer]   Education:  Engineer, agricultural   Tobacco Use/Exposure: None   Alcohol Use:  Occasional   Drug Use:  None   Diet:  Healthy   Exercise:  [employer]                      Objective:   Physical Exam  Vitals reviewed. Constitutional: She is oriented to person, place, and time. She appears well-developed and well-nourished.  HENT:  Head: Normocephalic and atraumatic.  Right Ear: External ear normal.  Left Ear: External ear normal.  Nose: Nose normal.  Mouth/Throat: Oropharynx is clear and moist.  Eyes: Conjunctivae and EOM are normal.  Pupils are equal, round, and reactive to light.  Neck: Normal range of motion. No thyromegaly present.  Cardiovascular: Normal rate, regular rhythm, normal heart sounds and intact distal pulses.  Exam reveals no gallop and no friction rub.   No murmur heard. Pulmonary/Chest: Effort normal and breath sounds normal. Right breast exhibits no inverted nipple, no mass, no nipple discharge, no skin change and no tenderness. Left breast exhibits no inverted nipple, no mass, no nipple discharge, no skin change and no tenderness. Breasts are symmetrical.  Abdominal: Soft. Bowel sounds are normal.  Genitourinary: Pelvic exam was performed with patient supine. There is rash on the right labia. There is no lesion on the right labia. There is rash on the left labia. There is no lesion on the left labia.  Vaginal Bleeding   Musculoskeletal: Normal range of motion. She exhibits no edema and no tenderness.  Lymphadenopathy:    She has no cervical adenopathy.  Neurological: She is alert and oriented to person, place, and time. She has normal reflexes.  Skin: Skin is warm and dry.     Psychiatric: She has a normal mood and affect. Her behavior is normal. Judgment and thought content normal.      Assessment & Plan:  [name] was seen today for annual exam.  Her bleeding was too heavy to do her pap.  She will return for this at a later date.    Diagnoses and associated orders for this visit:  Routine general medical examination at a health care facility - POCT urinalysis dipstick  Folliculitis - mupirocin cream (BACTROBAN) 2 %; Apply to affected area 2-3 times daily  Candidiasis of genitalia in female - nystatin-triamcinolone (MYCOLOG II) cream; Apply 1 application topically 2 (two) times daily.  Type II or unspecified type diabetes mellitus without mention of complication, not stated as uncontrolled - Microalbumin, urine

## 2013-03-06 LAB — MICROALBUMIN, URINE: Microalb, Ur: 0.5 mg/dL (ref 0.00–1.89)

## 2013-03-11 ENCOUNTER — Ambulatory Visit: Payer: Federal, State, Local not specified - PPO

## 2013-03-11 ENCOUNTER — Encounter: Payer: Self-pay | Admitting: *Deleted

## 2013-03-11 DIAGNOSIS — Z0000 Encounter for general adult medical examination without abnormal findings: Secondary | ICD-10-CM

## 2013-03-14 LAB — HEMOCCULT GUIAC POC 1CARD (OFFICE)
Card #2 Fecal Occult Blod, POC: NEGATIVE
Card #3 Fecal Occult Blood, POC: NEGATIVE
Fecal Occult Blood, POC: NEGATIVE

## 2013-04-25 ENCOUNTER — Ambulatory Visit (INDEPENDENT_AMBULATORY_CARE_PROVIDER_SITE_OTHER): Payer: Federal, State, Local not specified - PPO | Admitting: Family Medicine

## 2013-04-25 ENCOUNTER — Other Ambulatory Visit (HOSPITAL_COMMUNITY)
Admission: RE | Admit: 2013-04-25 | Discharge: 2013-04-25 | Disposition: A | Discharge status: Home / Self Care | Payer: Federal, State, Local not specified - PPO | Source: Ambulatory Visit | Attending: Family Medicine | Admitting: Family Medicine

## 2013-04-25 ENCOUNTER — Encounter: Payer: Self-pay | Admitting: Family Medicine

## 2013-04-25 ENCOUNTER — Encounter (INDEPENDENT_AMBULATORY_CARE_PROVIDER_SITE_OTHER): Payer: Self-pay

## 2013-04-25 VITALS — BP 104/70 | HR 97 | Resp 16 | Ht 59.0 in | Wt 189.0 lb

## 2013-04-25 DIAGNOSIS — Z1151 Encounter for screening for human papillomavirus (HPV): Secondary | ICD-10-CM | POA: Insufficient documentation

## 2013-04-25 DIAGNOSIS — Z124 Encounter for screening for malignant neoplasm of cervix: Secondary | ICD-10-CM | POA: Insufficient documentation

## 2013-04-25 DIAGNOSIS — R87810 Cervical high risk human papillomavirus (HPV) DNA test positive: Secondary | ICD-10-CM | POA: Insufficient documentation

## 2013-04-25 NOTE — Progress Notes (Signed)
   Subjective:    Patient ID: Morgan Barron, female    DOB: 20-Oct-1970, 42 y.o.   MRN: [id number]  HPI  Morgan "Karoline Caldwell" is her today for a pap.  She was here back in October for her CPE.  We had to postpone her pap because she was bleeding too much.  She does not have any medical concerns today.     Review of Systems  Genitourinary: Negative for vaginal bleeding and vaginal discharge.  All other systems reviewed and are negative.       Objective:   Physical Exam  Genitourinary: Vagina normal and uterus normal. There is no rash, tenderness or lesion on the right labia. There is no rash, tenderness or lesion on the left labia. No erythema or tenderness around the vagina. No vaginal discharge found.      Assessment & Plan:    Morgan Barron was seen today for gynecologic exam.  Diagnoses and associated orders for this visit:  Screening for malignant neoplasm of the cervix Comments: Her pap came back "NEGATIVE FOR INTRAEPITHELIAL LESIONS OR MALIGNANCY". She did test positive for HPV but was negative for 16/18 so according to the new guidelines, we'll redo her pap in one year and repeat the co-testing . She was not having any symptoms consistent with yeast.  She was instructed to use an OTC cream if she develops itching.  If needed, she will take Diflucan.   - Cytology - PAP

## 2013-07-05 ENCOUNTER — Other Ambulatory Visit: Payer: Self-pay | Admitting: Family Medicine

## 2013-07-10 ENCOUNTER — Other Ambulatory Visit: Payer: Self-pay | Admitting: *Deleted

## 2013-07-10 MED ORDER — IBUPROFEN-FAMOTIDINE 800-26.6 MG PO TABS: 1.0000 | ORAL_TABLET | Freq: Three times a day (TID) | ORAL | Status: DC

## 2013-07-10 MED ORDER — PANTOPRAZOLE SODIUM 40 MG PO TBEC: 40.0000 mg | DELAYED_RELEASE_TABLET | Freq: Every day | ORAL | Status: DC

## 2013-07-10 MED ORDER — CITALOPRAM HYDROBROMIDE 20 MG PO TABS: 20.0000 mg | ORAL_TABLET | Freq: Every day | ORAL | Status: DC

## 2013-07-31 ENCOUNTER — Other Ambulatory Visit: Payer: Self-pay | Admitting: *Deleted

## 2013-07-31 DIAGNOSIS — E039 Hypothyroidism, unspecified: Secondary | ICD-10-CM

## 2013-07-31 DIAGNOSIS — E785 Hyperlipidemia, unspecified: Secondary | ICD-10-CM

## 2013-07-31 DIAGNOSIS — IMO0001 Reserved for inherently not codable concepts without codable children: Secondary | ICD-10-CM

## 2013-07-31 DIAGNOSIS — R5383 Other fatigue: Secondary | ICD-10-CM

## 2013-07-31 DIAGNOSIS — E1165 Type 2 diabetes mellitus with hyperglycemia: Principal | ICD-10-CM

## 2013-07-31 DIAGNOSIS — R5381 Other malaise: Secondary | ICD-10-CM

## 2013-08-01 ENCOUNTER — Other Ambulatory Visit: Payer: Federal, State, Local not specified - PPO

## 2013-08-01 LAB — COMPLETE METABOLIC PANEL WITH GFR
ALT: 16 U/L (ref 0–35)
AST: 19 U/L (ref 0–37)
Albumin: 4 g/dL (ref 3.5–5.2)
Alkaline Phosphatase: 67 U/L (ref 39–117)
BUN: 11 mg/dL (ref 6–23)
CO2: 24 mEq/L (ref 19–32)
Calcium: 8.7 mg/dL (ref 8.4–10.5)
Chloride: 104 mEq/L (ref 96–112)
Creat: 0.58 mg/dL (ref 0.50–1.10)
GFR, Est African American: >89 mL/min
GFR, Est Non African American: >89 mL/min
Glucose, Bld: 86 mg/dL (ref 70–99)
Potassium: 4 mEq/L (ref 3.5–5.3)
Sodium: 137 mEq/L (ref 135–145)
Total Bilirubin: 0.5 mg/dL (ref 0.2–1.2)
Total Protein: 6.5 g/dL (ref 6.0–8.3)

## 2013-08-01 LAB — CBC WITH DIFFERENTIAL/PLATELET
Basophils Absolute: 0 10*3/uL (ref 0.0–0.1)
Basophils Relative: 0 % (ref 0–1)
Eosinophils Absolute: 0.3 10*3/uL (ref 0.0–0.7)
Eosinophils Relative: 4 % (ref 0–5)
HCT: 37.4 % (ref 36.0–46.0)
Hemoglobin: 12.3 g/dL (ref 12.0–15.0)
Lymphocytes Relative: 29 % (ref 12–46)
Lymphs Abs: 2 10*3/uL (ref 0.7–4.0)
MCH: 23.6 pg — ABNORMAL LOW (ref 26.0–34.0)
MCHC: 32.9 g/dL (ref 30.0–36.0)
MCV: 71.8 fL — ABNORMAL LOW (ref 78.0–100.0)
Monocytes Absolute: 0.5 10*3/uL (ref 0.1–1.0)
Monocytes Relative: 7 % (ref 3–12)
Neutro Abs: 4.2 10*3/uL (ref 1.7–7.7)
Neutrophils Relative %: 60 % (ref 43–77)
Platelets: 307 10*3/uL (ref 150–400)
RBC: 5.21 MIL/uL — ABNORMAL HIGH (ref 3.87–5.11)
RDW: 16.8 % — ABNORMAL HIGH (ref 11.5–15.5)
WBC: 7 10*3/uL (ref 4.0–10.5)

## 2013-08-01 LAB — LIPID PANEL
Cholesterol: 136 mg/dL (ref 0–200)
HDL: 36 mg/dL — ABNORMAL LOW (ref >39)
LDL Cholesterol: 68 mg/dL (ref 0–99)
Total CHOL/HDL Ratio: 3.8 Ratio
Triglycerides: 162 mg/dL — ABNORMAL HIGH (ref <150)
VLDL: 32 mg/dL (ref 0–40)

## 2013-08-01 LAB — HEMOGLOBIN A1C
Hgb A1c MFr Bld: 6.4 % — ABNORMAL HIGH (ref <5.7)
Mean Plasma Glucose: 137 mg/dL — ABNORMAL HIGH (ref <117)

## 2013-08-01 LAB — TSH: TSH: 0.112 u[IU]/mL — ABNORMAL LOW (ref 0.350–4.500)

## 2013-08-01 LAB — T4, FREE: Free T4: 1.38 ng/dL (ref 0.80–1.80)

## 2013-08-08 ENCOUNTER — Ambulatory Visit (INDEPENDENT_AMBULATORY_CARE_PROVIDER_SITE_OTHER): Payer: Federal, State, Local not specified - PPO | Admitting: Family Medicine

## 2013-08-08 ENCOUNTER — Encounter: Payer: Self-pay | Admitting: Family Medicine

## 2013-08-08 VITALS — BP 130/78 | HR 103 | Resp 16 | Wt 190.0 lb

## 2013-08-08 DIAGNOSIS — IMO0001 Reserved for inherently not codable concepts without codable children: Secondary | ICD-10-CM

## 2013-08-08 DIAGNOSIS — K219 Gastro-esophageal reflux disease without esophagitis: Secondary | ICD-10-CM

## 2013-08-08 DIAGNOSIS — R51 Headache: Secondary | ICD-10-CM

## 2013-08-08 DIAGNOSIS — F411 Generalized anxiety disorder: Secondary | ICD-10-CM

## 2013-08-08 DIAGNOSIS — I10 Essential (primary) hypertension: Secondary | ICD-10-CM

## 2013-08-08 DIAGNOSIS — M7711 Lateral epicondylitis, right elbow: Secondary | ICD-10-CM

## 2013-08-08 DIAGNOSIS — M7710 Lateral epicondylitis, unspecified elbow: Secondary | ICD-10-CM

## 2013-08-08 DIAGNOSIS — E119 Type 2 diabetes mellitus without complications: Secondary | ICD-10-CM

## 2013-08-08 MED ORDER — SITAGLIP PHOS-METFORMIN HCL ER 100-1000 MG PO TB24
1.0000 | ORAL_TABLET | Freq: Every morning | ORAL | Status: AC
Start: 2013-08-08 — End: 2014-08-08

## 2013-08-08 MED ORDER — IBUPROFEN-FAMOTIDINE 800-26.6 MG PO TABS: 1.0000 | ORAL_TABLET | Freq: Three times a day (TID) | ORAL | Status: AC

## 2013-08-08 MED ORDER — CITALOPRAM HYDROBROMIDE 20 MG PO TABS: 20.0000 mg | ORAL_TABLET | Freq: Every day | ORAL | Status: AC

## 2013-08-08 MED ORDER — PANTOPRAZOLE SODIUM 40 MG PO TBEC: 40.0000 mg | DELAYED_RELEASE_TABLET | Freq: Every day | ORAL | Status: AC

## 2013-08-08 MED ORDER — CANAGLIFLOZIN 300 MG PO TABS: 300.0000 mg | ORAL_TABLET | Freq: Every day | ORAL | Status: AC

## 2013-08-08 MED ORDER — DICLOFENAC SODIUM 1 % TD GEL: TRANSDERMAL | Status: AC

## 2013-08-08 MED ORDER — CYCLOBENZAPRINE HCL 10 MG PO TABS: 10.0000 mg | ORAL_TABLET | Freq: Every day | ORAL | Status: AC

## 2013-08-08 MED ORDER — METOPROLOL SUCCINATE ER 50 MG PO TB24: ORAL_TABLET | ORAL | Status: AC

## 2013-08-08 NOTE — Patient Instructions (Signed)
1)  Blood Sugar - Look into getting the book "The End of Diabetes" by Dr. Monico HoarJoel Fuhrman.  If you could follow this at least 1 meal day and exercise 1 hour per day you could get your A1c <6 and if you really embraced changing your diet according to his plan you could come off of several of your medications.     Insulin Resistance Blood sugar (glucose) levels are controlled by a hormone called insulin. Insulin is made by your pancreas. When your blood glucose goes up, insulin is released into your blood. Insulin is required for your body to function normally. However, your body can become resistant to your own insulin or to insulin given to treat diabetes. In either case, insulin resistance can lead to serious problems. These problems include:  Type 2 diabetes.  Heart disease.  High blood pressure.  Stroke.  Polycystic ovary syndrome.  Fatty liver. CAUSES  Insulin resistance can develop for many different reasons. It is more likely to happen in people with these conditions or characteristics:  Obesity.  Inactivity.  Pregnancy.  High blood pressure.  Stress.  Steroid use.  Infection or severe illness.  Increased levels of cholesterol and triglycerides. SYMPTOMS  There are no symptoms. You may have symptoms related to the various complications of insulin resistance.  DIAGNOSIS  Several different things can make your caregiver suspect you have insulin resistance. These include:  High blood glucose (hyperglycemia).  Abnormal cholesterol levels.  High uric acid levels.  Changes related to blood pressure.  Changes related to inflammation. Insulin resistance can be determined with blood tests. An elevated insulin level when you have not eaten might suggest resistance. Other more complicated tests are sometimes necessary. TREATMENT  Lifestyle changes are the most important treatment for insulin resistance.   If you are overweight and you have insulin resistance, you can  improve your insulin sensitivity by losing weight.  Moderate exercise for 30 40 minutes, 4 days a week, can improve insulin sensitivity. Some medicines can also help improve your insulin sensitivity. Your caregiver can discuss these with you if they are appropriate.  HOME CARE INSTRUCTIONS   Do not smoke.  Keep your weight at a healthy level.  Get exercise.  If you have diabetes, follow your caregiver's directions.  If you have high blood pressure, follow your caregiver's directions.  Only take prescription medicines for pain, fever, or discomfort as directed by your caregiver. SEEK MEDICAL CARE IF:   You are diabetic and you are having problems keeping your blood glucose levels at target range.  You are having episodes of low blood glucose (hypoglycemia).  You feel you might be having side effects from your medicines.  You have symptoms of an illness that is not improving after 3 4 days.  You have a sore or wound that is not healing.  You notice a change in vision or a new problem with your vision. SEEK IMMEDIATE MEDICAL CARE IF:   Your blood glucose goes below 70, especially if you have confusion, lightheadedness, or other symptoms with it.  Your blood glucose is very high (as advised by your caregiver) twice in a row.  You pass out.  You have chest pain or trouble breathing.  You have a sudden, severe headache.  You have sudden weakness in one arm or one leg.  You have sudden difficulty speaking or swallowing.  You develop vomiting or diarrhea that is getting worse or not improving after 1 day. Document Released: 06/27/2005 Document Revised: 11/07/2011  Document Reviewed: 10/17/2012 [facility] Patient Information 2014 [place], [place].

## 2013-08-08 NOTE — Progress Notes (Signed)
Subjective:    Patient ID: Morgan Barron, female    DOB: [birth year], 43 y.o.   MRN: [id number]  HPI  Morgan Barron is here today to go over her most recent lab results.  She has done well since her last office visit.  She needs a refill on some of her medications.    1)  Type II DM - She is doing well with her combination of Janumet (100/1000 mg, once daily) and Invokana (300 mg daily).  She needs both medications refilled.   2)  Back Pain - Her pain is controlled with cyclobenzaprine (10 mg at bedtime).  She needs a refill on it.   3)  Hypertension - Her blood pressure is well controlled with her metoprolol and Exforge.  She needs a refill on her metoprolol.    4)  Mood - Her mood is stable with her (citalopram 20 mg) and she needs a refill on it.    5)  GERD - She needs a refill on her Pantoprazole.    6)  Elbow Pain - She has been having elbow pain.     Review of Systems  Constitutional: Negative for activity change, fatigue and unexpected weight change.  HENT: Negative.   Eyes: Negative.   Respiratory: Negative for shortness of breath.   Cardiovascular: Negative for chest pain, palpitations and leg swelling.  Gastrointestinal: Negative for diarrhea and constipation.  Endocrine: Negative.   Genitourinary: Negative for difficulty urinating.  Musculoskeletal:       Pain in right elbow  Skin: Negative.   Neurological: Negative.   Hematological: Negative for adenopathy. Does not bruise/bleed easily.  Psychiatric/Behavioral: Negative for sleep disturbance and dysphoric mood. The patient is not nervous/anxious.      Past Medical History  Diagnosis Date  . Migraine with aura, without mention of intractable migraine without mention of status migrainosus   . Hypertension   . Hyperlipidemia   . Chronic kidney disease     Kidney Stones  . Thyroid disease     Hypothyroidism  . GERD (gastroesophageal reflux disease)   . Anemia   . Ovarian cyst     Left Ovary  . Impaired  fasting glucose   . Diabetes mellitus without complication   . Kidney stones      Past Surgical History  Procedure Laterality Date  . Back surgery  1999  . Cholecystectomy    . Mouth surgery  1991     History   Social History Narrative   Marital Status:  Single   Living Situation: Lives with father and brother   Occupation: [employer]   Education:  Engineer, agriculturalHigh School Graduate   Tobacco Use/Exposure: None   Alcohol Use:  Occasional   Drug Use:  None   Diet:  Healthy   Exercise:  [employer]                    Family History  Problem Relation Age of Onset  . Diabetes Mother   . Hypertension Mother   . Heart disease Mother   . Renal Disease Mother   . Stroke Brother   . Diabetes Maternal Grandmother      Current Outpatient Prescriptions on File Prior to Visit  Medication Sig Dispense Refill  . Amlodipine-Valsartan-HCTZ (EXFORGE HCT) 5-160-25 MG TABS Take 1 tablet by mouth every morning.  30 tablet  11  . famciclovir (FAMVIR) 500 MG tablet Take 500 mg by mouth 3 (three) times daily.      .Marland Kitchen  glucose blood test strip 1 each by Other route as needed for other. Use as instructed      . Lancets MISC by Does not apply route.      . Levonorgestrel-Ethinyl Estradiol (SEASONIQUE) 0.15-0.03 &0.01 MG tablet Take 1 tablet by mouth daily.  1 Package  4  . levothyroxine (SYNTHROID, LEVOTHROID) 150 MCG tablet Take 1 tablet (150 mcg total) by mouth daily before breakfast.  90 tablet  3  . nystatin-triamcinolone (MYCOLOG II) cream Apply 1 application topically 2 (two) times daily.  60 g  11  . rizatriptan (MAXALT-MLT) 10 MG disintegrating tablet Take 1 tablet (10 mg total) by mouth as needed for migraine. May repeat in 2 hours if needed  12 tablet  11  . rosuvastatin (CRESTOR) 40 MG tablet Take 1/2 - 1 tab daily  30 tablet  5   No current facility-administered medications on file prior to visit.     Allergies  Allergen Reactions  . Codeine      Immunization History    Administered Date(s) Administered  . Influenza,inj,Quad PF,36+ Mos 01/22/2013  . Pneumococcal-Unspecified 01/22/2011  . Tdap 02/26/2009       Objective:   Physical Exam  Vitals reviewed. Constitutional: She is oriented to person, place, and time.  Eyes: Conjunctivae are normal. No scleral icterus.  Neck: Neck supple. No thyromegaly present.  Cardiovascular: Normal rate, regular rhythm and normal heart sounds.   Pulmonary/Chest: Effort normal and breath sounds normal.  Musculoskeletal: She exhibits tenderness. She exhibits no edema.  Lymphadenopathy:    She has no cervical adenopathy.  Neurological: She is alert and oriented to person, place, and time. Abnormal reflex: Pain in right elbow.  Skin: Skin is warm and dry.  Psychiatric: She has a normal mood and affect. Her behavior is normal. Judgment and thought content normal.       Assessment & Plan:    Morgan Barron was seen today for medication management.  Diagnoses and associated orders for this visit:  Type II or unspecified type diabetes mellitus without mention of complication, not stated as uncontrolled - SitaGLIPtin-MetFORMIN HCl (JANUMET XR) [phone] MG TB24; Take 1 tablet by mouth every morning. - Canagliflozin (INVOKANA) 300 MG TABS; Take 1 tablet (300 mg total) by mouth daily.  Headache(784.0) - Ibuprofen-Famotidine (DUEXIS) 800-26.6 MG TABS; Take 1 tablet by mouth 3 (three) times daily.  Essential hypertension, benign - metoprolol succinate (TOPROL-XL) 50 MG 24 hr tablet; Take 1 tab po q hs  GERD (gastroesophageal reflux disease) - pantoprazole (PROTONIX) 40 MG tablet; Take 1 tablet (40 mg total) by mouth daily.  Anxiety state, unspecified - citalopram (CELEXA) 20 MG tablet; Take 1 tablet (20 mg total) by mouth daily.  Myalgia and myositis - cyclobenzaprine (FLEXERIL) 10 MG tablet; Take 1 tablet (10 mg total) by mouth at bedtime.  Lateral epicondylitis of right elbow - diclofenac sodium (VOLTAREN) 1 % GEL;  Apply up to 4 grams to 2 painful areas up to QID for pain   TIME SPENT "FACE TO FACE" WITH PATIENT -  45 MINS

## 2014-01-11 ENCOUNTER — Other Ambulatory Visit: Payer: Self-pay | Admitting: Family Medicine

## 2014-02-13 ENCOUNTER — Other Ambulatory Visit: Payer: Federal, State, Local not specified - PPO

## 2014-02-20 ENCOUNTER — Ambulatory Visit: Payer: Federal, State, Local not specified - PPO | Admitting: Family Medicine

## 2014-03-22 DIAGNOSIS — F419 Anxiety disorder, unspecified: Secondary | ICD-10-CM | POA: Insufficient documentation

## 2015-09-06 ENCOUNTER — Other Ambulatory Visit (HOSPITAL_BASED_OUTPATIENT_CLINIC_OR_DEPARTMENT_OTHER): Payer: Self-pay | Admitting: Family Medicine

## 2015-09-06 ENCOUNTER — Ambulatory Visit (HOSPITAL_BASED_OUTPATIENT_CLINIC_OR_DEPARTMENT_OTHER)
Admission: RE | Admit: 2015-09-06 | Discharge: 2015-09-06 | Disposition: A | Discharge status: Home / Self Care | Payer: Federal, State, Local not specified - PPO | Source: Ambulatory Visit | Attending: Family Medicine | Admitting: Family Medicine

## 2015-09-06 DIAGNOSIS — R059 Cough, unspecified: Secondary | ICD-10-CM

## 2015-09-06 DIAGNOSIS — R05 Cough: Secondary | ICD-10-CM

## 2016-04-24 DIAGNOSIS — K219 Gastro-esophageal reflux disease without esophagitis: Secondary | ICD-10-CM | POA: Insufficient documentation

## 2017-03-08 ENCOUNTER — Ambulatory Visit (INDEPENDENT_AMBULATORY_CARE_PROVIDER_SITE_OTHER): Payer: Federal, State, Local not specified - PPO

## 2017-03-08 ENCOUNTER — Encounter: Payer: Self-pay | Admitting: Podiatry

## 2017-03-08 ENCOUNTER — Ambulatory Visit (INDEPENDENT_AMBULATORY_CARE_PROVIDER_SITE_OTHER): Payer: Federal, State, Local not specified - PPO | Admitting: Podiatry

## 2017-03-08 VITALS — BP 123/76 | HR 100 | Resp 18

## 2017-03-08 DIAGNOSIS — M659 Synovitis and tenosynovitis, unspecified: Secondary | ICD-10-CM

## 2017-03-08 DIAGNOSIS — M21621 Bunionette of right foot: Secondary | ICD-10-CM

## 2017-03-08 DIAGNOSIS — M21622 Bunionette of left foot: Secondary | ICD-10-CM | POA: Insufficient documentation

## 2017-03-08 MED ORDER — MELOXICAM 15 MG PO TABS: 15.0000 mg | ORAL_TABLET | Freq: Every day | ORAL | 0 refills | Status: AC

## 2017-03-12 DIAGNOSIS — M21621 Bunionette of right foot: Secondary | ICD-10-CM | POA: Insufficient documentation

## 2017-03-12 NOTE — Progress Notes (Signed)
Subjective:    Patient ID: Morgan Barron, female   DOB: 46 y.o.   MRN: [id number]   HPI Morgan Barron Presents the office today for concerns of pain in the also aspects of both of her feet and she points along the area the tailor's bunion. She states this been ongoing for some time and is painful with pressure in shoes. She denies any recent injury or trauma. She has numbness or tingling. She's had no recent treatment for this. She has no other concerns today.   Review of Systems  All other systems reviewed and are negative.  Past Medical History:  Diagnosis Date  . Anemia   . Chronic kidney disease    Kidney Stones  . Diabetes mellitus without complication (HCC)   . GERD (gastroesophageal reflux disease)   . Hyperlipidemia   . Hypertension   . Impaired fasting glucose   . Kidney stones   . Migraine with aura, without mention of intractable migraine without mention of status migrainosus   . Ovarian cyst    Left Ovary  . Thyroid disease    Hypothyroidism    Past Surgical History:  Procedure Laterality Date  . BACK SURGERY  1999  . CHOLECYSTECTOMY    . MOUTH SURGERY  1991     Current Outpatient Prescriptions:  .  canagliflozin (INVOKANA) 300 MG TABS tablet, Take 300 mg by mouth., Disp: , Rfl:  .  Dulaglutide 1.5 MG/0.5ML SOPN, Inject 1.5 mg into the skin., Disp: , Rfl:  .  gabapentin (NEURONTIN) 300 MG capsule, Take 300 mg by mouth 3 (three) times daily., Disp: , Rfl:  .  Amlodipine-Valsartan-HCTZ (EXFORGE HCT) 5-160-25 MG TABS, Take 1 tablet by mouth every morning., Disp: 30 tablet, Rfl: 11 .  citalopram (CELEXA) 20 MG tablet, Take 1 tablet (20 mg total) by mouth daily., Disp: 90 tablet, Rfl: 1 .  famciclovir (FAMVIR) 500 MG tablet, Take 500 mg by mouth 3 (three) times daily., Disp: , Rfl:  .  glucose blood test strip, 1 each by Other route as needed for other. Use as instructed, Disp: , Rfl:  .  Lancets MISC, by Does not apply route., Disp: , Rfl:  .   Levonorgestrel-Ethinyl Estradiol (SEASONIQUE) 0.15-0.03 &0.01 MG tablet, Take 1 tablet by mouth daily., Disp: 1 Package, Rfl: 4 .  levothyroxine (SYNTHROID, LEVOTHROID) 150 MCG tablet, Take 1 tablet (150 mcg total) by mouth daily before breakfast., Disp: 90 tablet, Rfl: 3 .  meloxicam (MOBIC) 15 MG tablet, Take 1 tablet (15 mg total) by mouth daily., Disp: 30 tablet, Rfl: 0 .  pantoprazole (PROTONIX) 40 MG tablet, Take 1 tablet (40 mg total) by mouth daily., Disp: 30 tablet, Rfl: 11 .  rizatriptan (MAXALT-MLT) 10 MG disintegrating tablet, Take 1 tablet (10 mg total) by mouth as needed for migraine. May repeat in 2 hours if needed, Disp: 12 tablet, Rfl: 11 .  rosuvastatin (CRESTOR) 40 MG tablet, Take 1/2 - 1 tab daily, Disp: 30 tablet, Rfl: 5 .  SitaGLIPtin-MetFORMIN HCl (JANUMET XR) [phone] MG TB24, Take 1 tablet by mouth every morning., Disp: 30 tablet, Rfl: 5  Allergies  Allergen Reactions  . Codeine Nausea And Vomiting    Social History   Social History  . Marital status: Divorced    Spouse name: N/A  . Number of children: 1  . Years of education: 61   Occupational History  . POSTAL WORKER  [employer]   Social History Main Topics  . Smoking status: Never Smoker  .  Smokeless tobacco: Never Used  . Alcohol use Yes  . Drug use: No  . Sexual activity: Yes    Partners: Male    Birth control/ protection: Pill   Other Topics Concern  . Not on file   Social History Narrative   Marital Status:  Single   Living Situation: Lives with father and brother   Occupation: [employer]   Education:  Engineer, agriculturalHigh School Graduate   Tobacco Use/Exposure: None   Alcohol Use:  Occasional   Drug Use:  None   Diet:  Healthy   Exercise:  [employer]                        Objective:  Physical Exam General: AAO x3, NAD  Dermatological: Skin is warm, dry and supple bilateral. Nails x 10 are well manicured; remaining integument appears unremarkable at this time. There are no open  sores, no preulcerative lesions, no rash or signs of infection present.  Vascular: Dorsalis Pedis artery and Posterior Tibial artery pedal pulses are 2/4 bilateral with immedate capillary fill time.No varicosities and no lower extremity edema present bilateral. There is no pain with calf compression, swelling, warmth, erythema.   Neruologic: Grossly intact via light touch bilateral. Protective threshold with Semmes Wienstein monofilament intact to all pedal sites bilateral. Patellar and Achilles deep tendon reflexes 2+ bilateral. No Babinski or clonus noted bilateral.   Musculoskeletal: There is tenderness along the lateral aspect of bilateral fifth metatarsal head area the tailor's bunion. This 8 erythema from irritation set issues but there is no warmth or skin breakdown. There is no other area pinpoint tenderness there is no pain vibratory sensation. There is mild decrease in medial arch upon weightbearing. There is no pain on the Achilles tendon, plantar fashion both appear to be intact. Range of motion intact. Muscular strength 5/5 in all groups tested bilateral.  Gait: Unassisted, Nonantalgic.      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 Lateral prominence of the metatarsal head consistent with a Taylor's bunion. Is no evidence of acute fracture. Heel spurring is present. -We discussed both conservative as well as surgical treatment options at this point. She said no conservative treatments start with this. Discussed with her offloading pads, shoe modifications. Also think that adding an insert and set up her shoes will help increase her arch and may help take pressure off the tailor's bunions. Discussed power step insert. Declined steroid injection.  Ovid CurdMatthew Morgan Barron,  DPM

## 2017-07-01 IMAGING — DX DG CHEST 2V
2 series · 2 of 2 positions shown · non-contrast
Comparison: 06/23/2007

CLINICAL DATA: Cough for 4 weeks.  Intermittent fever.

EXAM:
CHEST  2 VIEW

[chest pa]
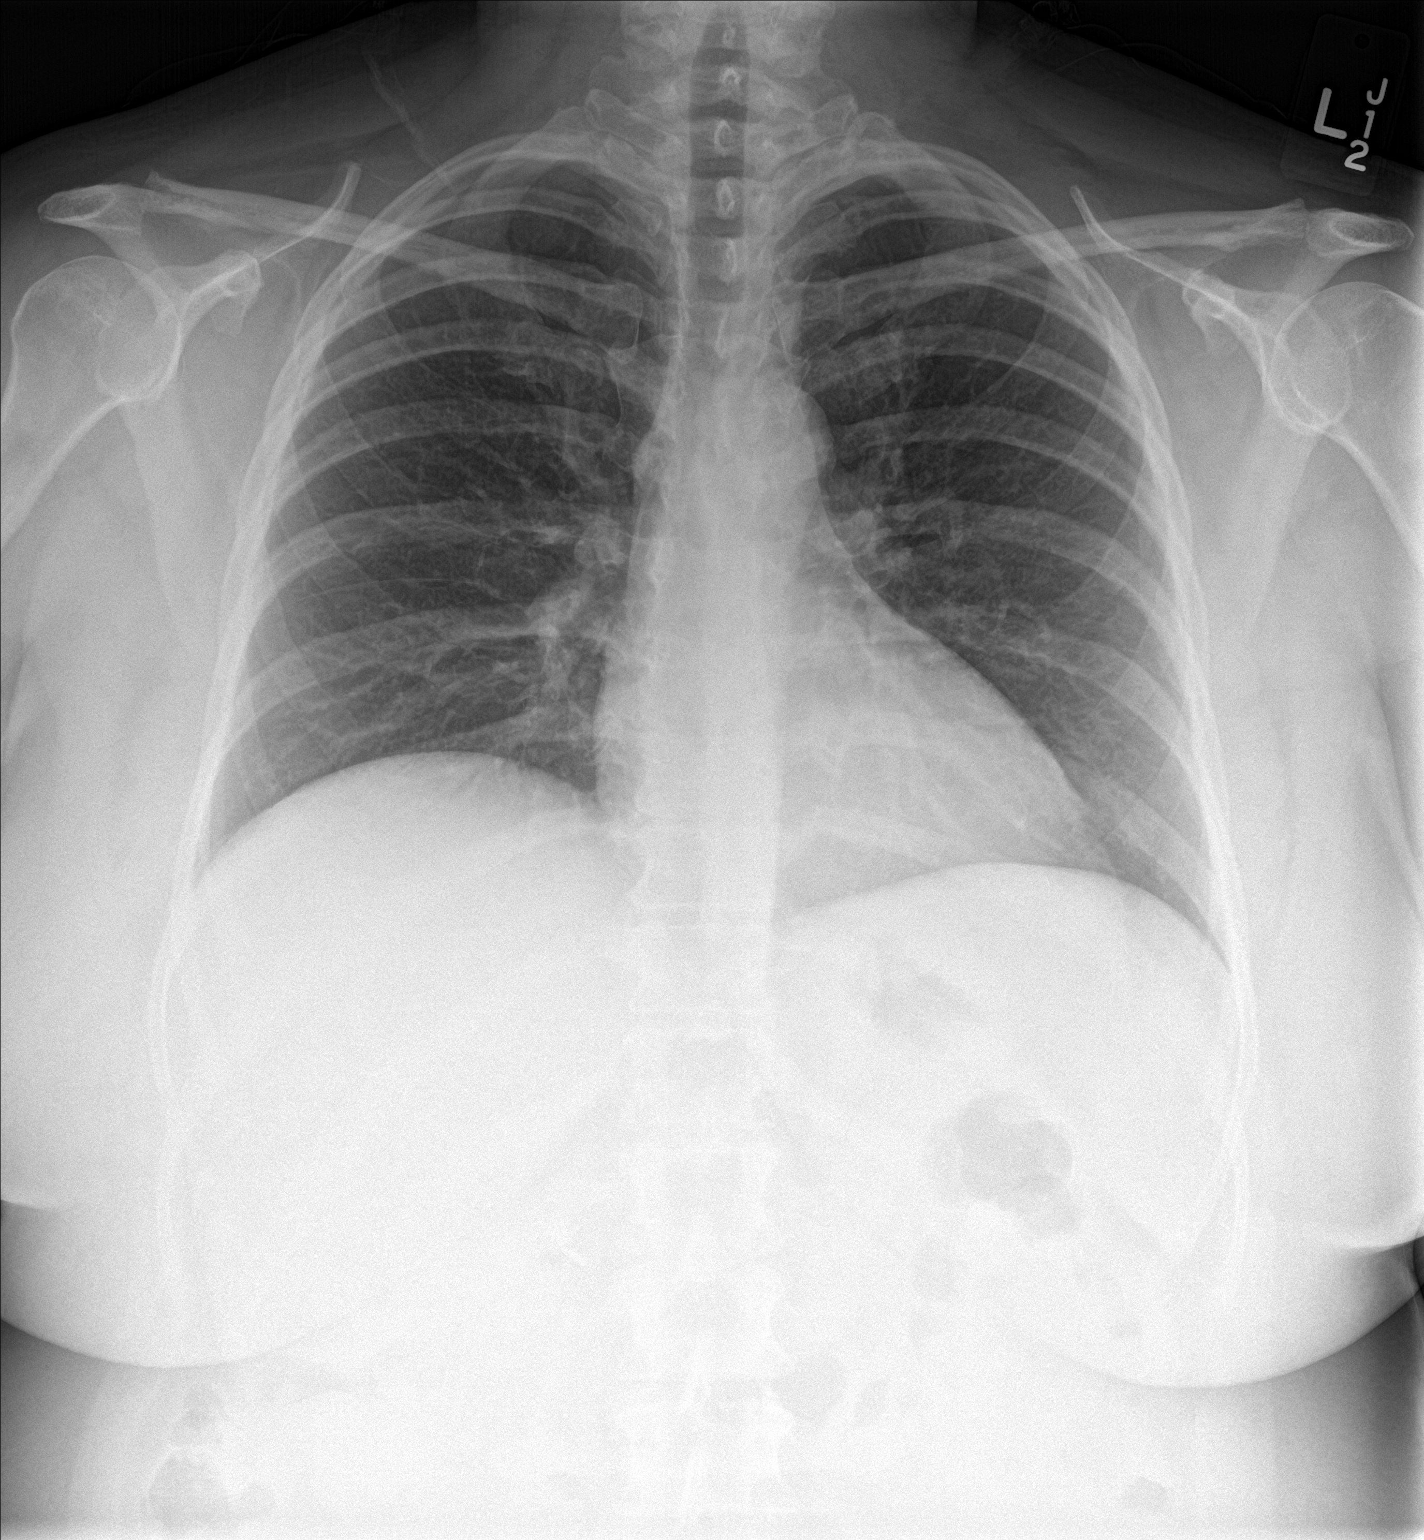

[chest lat]
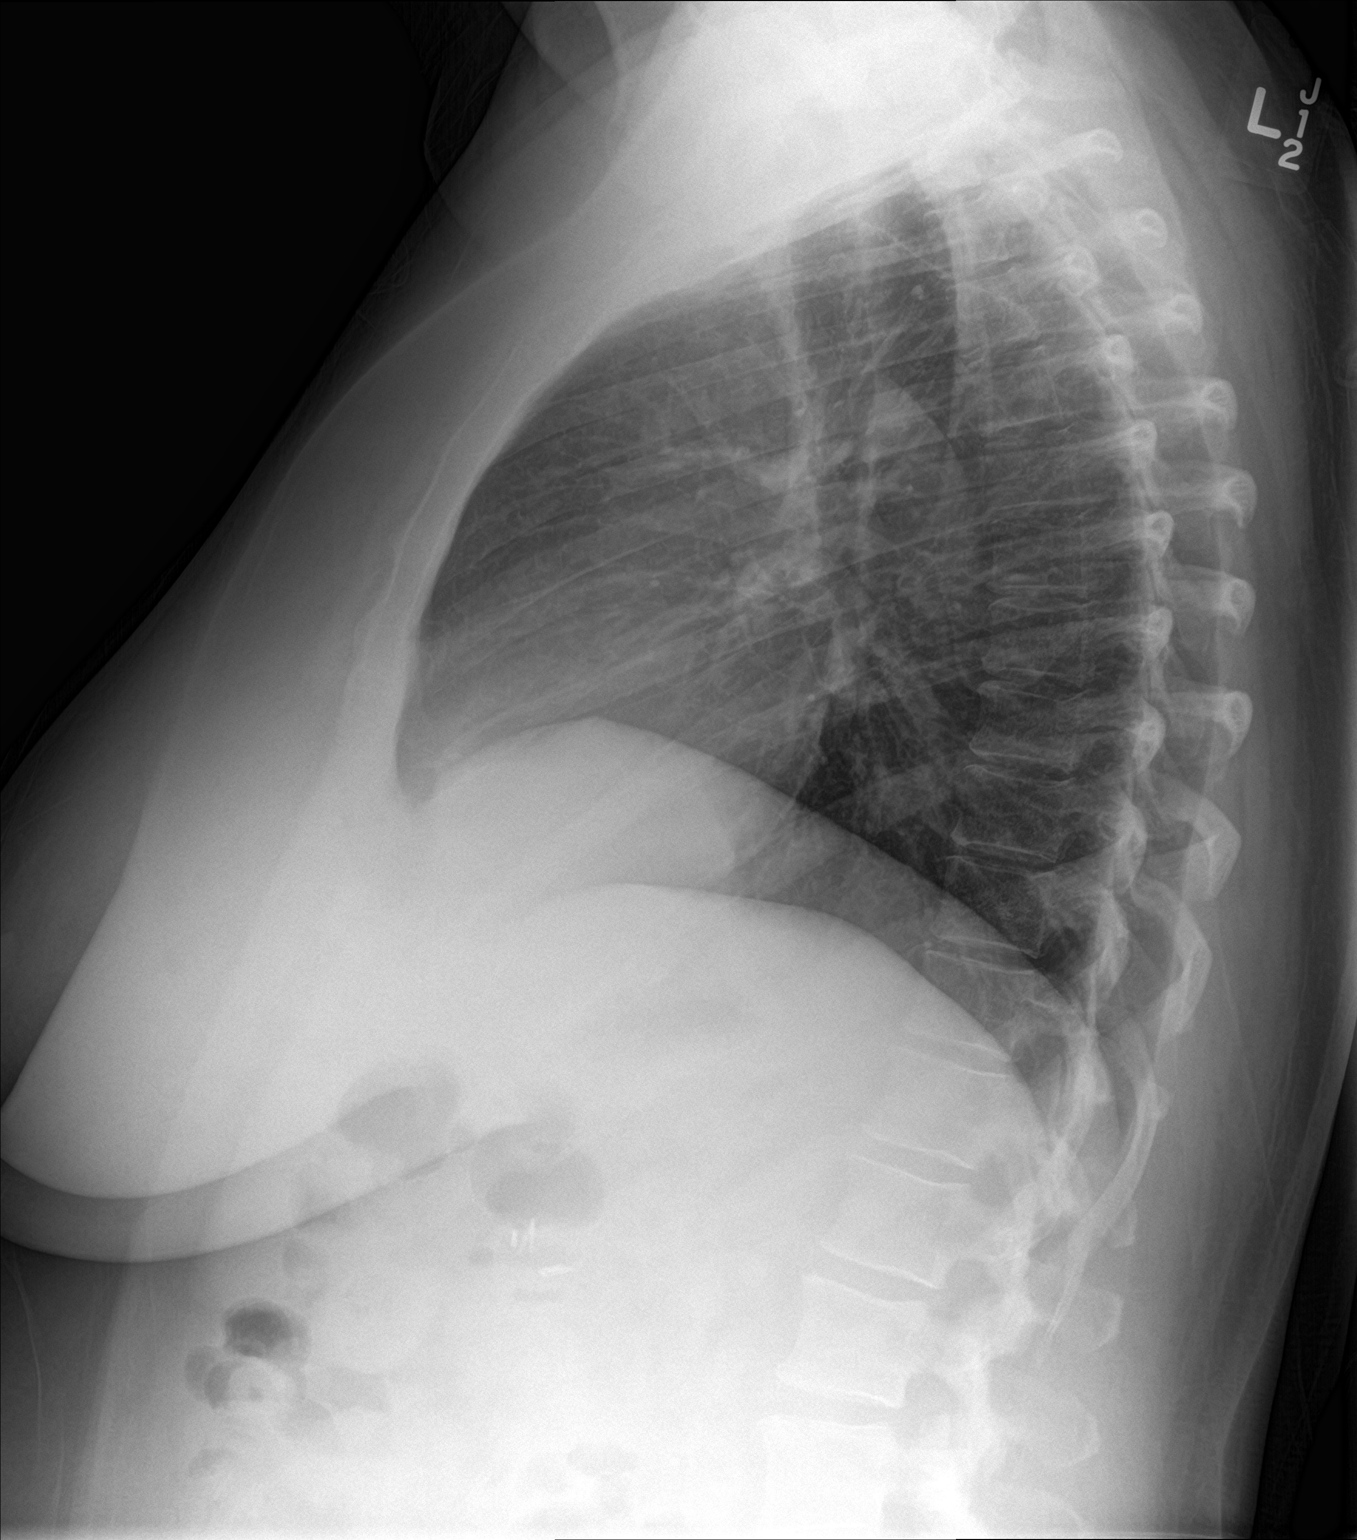

[2 of 2 positions shown; findings below may reference images not displayed]

FINDINGS: The heart size and mediastinal contours are within normal limits.
Both lungs are clear. The visualized skeletal structures are
unremarkable.
IMPRESSION: No active cardiopulmonary disease.

## 2022-09-28 ENCOUNTER — Other Ambulatory Visit (HOSPITAL_BASED_OUTPATIENT_CLINIC_OR_DEPARTMENT_OTHER): Payer: Self-pay

## 2022-09-28 MED ORDER — TRULICITY 3 MG/0.5ML ~~LOC~~ SOAJ
3.0000 mg | SUBCUTANEOUS | 0 refills | Status: DC
  Filled 2022-09-28: qty 2, 28d supply, fill #0

## 2022-11-01 ENCOUNTER — Other Ambulatory Visit (HOSPITAL_BASED_OUTPATIENT_CLINIC_OR_DEPARTMENT_OTHER): Payer: Self-pay

## 2022-11-01 MED ORDER — TRULICITY 4.5 MG/0.5ML ~~LOC~~ SOAJ
4.5000 mg | SUBCUTANEOUS | 0 refills | Status: DC
  Filled 2022-11-01: qty 2, 28d supply, fill #0

## 2022-11-28 ENCOUNTER — Other Ambulatory Visit (HOSPITAL_BASED_OUTPATIENT_CLINIC_OR_DEPARTMENT_OTHER): Payer: Self-pay

## 2022-11-28 MED ORDER — TRULICITY 4.5 MG/0.5ML ~~LOC~~ SOAJ
4.5000 mg | SUBCUTANEOUS | 0 refills | Status: DC
  Filled 2022-11-28: qty 2, 28d supply, fill #0

## 2022-11-30 ENCOUNTER — Other Ambulatory Visit (HOSPITAL_BASED_OUTPATIENT_CLINIC_OR_DEPARTMENT_OTHER): Payer: Self-pay

## 2022-12-26 ENCOUNTER — Other Ambulatory Visit (HOSPITAL_BASED_OUTPATIENT_CLINIC_OR_DEPARTMENT_OTHER): Payer: Self-pay

## 2022-12-26 MED ORDER — TRULICITY 4.5 MG/0.5ML ~~LOC~~ SOAJ
4.5000 mg | SUBCUTANEOUS | 1 refills | Status: AC
  Filled 2022-12-26: qty 2, 28d supply, fill #0
  Filled 2023-01-29: qty 2, 28d supply, fill #1

## 2023-01-04 ENCOUNTER — Other Ambulatory Visit (HOSPITAL_BASED_OUTPATIENT_CLINIC_OR_DEPARTMENT_OTHER): Payer: Self-pay

## 2023-01-04 MED ORDER — BASAGLAR KWIKPEN 100 UNIT/ML ~~LOC~~ SOPN
30.0000 [IU] | PEN_INJECTOR | Freq: Every evening | SUBCUTANEOUS | 1 refills | Status: DC
  Filled 2023-01-04: qty 30, 100d supply, fill #0

## 2023-01-04 MED ORDER — FREESTYLE LIBRE 3 SENSOR MISC
1.0000 | 3 refills | Status: DC
  Filled 2023-01-04: qty 4, 56d supply, fill #0
  Filled 2023-02-12 – 2023-02-19 (×2): qty 4, 56d supply, fill #1
  Filled 2023-03-30 – 2023-04-28 (×2): qty 4, 56d supply, fill #2
  Filled 2023-06-23 – 2023-06-28 (×2): qty 4, 56d supply, fill #3

## 2023-01-08 ENCOUNTER — Other Ambulatory Visit (HOSPITAL_BASED_OUTPATIENT_CLINIC_OR_DEPARTMENT_OTHER): Payer: Self-pay

## 2023-01-09 ENCOUNTER — Other Ambulatory Visit (HOSPITAL_BASED_OUTPATIENT_CLINIC_OR_DEPARTMENT_OTHER): Payer: Self-pay

## 2023-01-29 ENCOUNTER — Other Ambulatory Visit (HOSPITAL_BASED_OUTPATIENT_CLINIC_OR_DEPARTMENT_OTHER): Payer: Self-pay

## 2023-02-12 ENCOUNTER — Other Ambulatory Visit (HOSPITAL_BASED_OUTPATIENT_CLINIC_OR_DEPARTMENT_OTHER): Payer: Self-pay

## 2023-03-30 ENCOUNTER — Other Ambulatory Visit (HOSPITAL_BASED_OUTPATIENT_CLINIC_OR_DEPARTMENT_OTHER): Payer: Self-pay

## 2023-04-09 ENCOUNTER — Other Ambulatory Visit (HOSPITAL_BASED_OUTPATIENT_CLINIC_OR_DEPARTMENT_OTHER): Payer: Self-pay

## 2023-04-09 MED ORDER — OZEMPIC (2 MG/DOSE) 8 MG/3ML ~~LOC~~ SOPN
2.0000 mg | PEN_INJECTOR | SUBCUTANEOUS | 0 refills | Status: AC
  Filled 2023-04-09: qty 3, 28d supply, fill #0

## 2023-06-28 ENCOUNTER — Other Ambulatory Visit (HOSPITAL_BASED_OUTPATIENT_CLINIC_OR_DEPARTMENT_OTHER): Payer: Self-pay

## 2023-06-29 ENCOUNTER — Other Ambulatory Visit (HOSPITAL_COMMUNITY): Payer: Self-pay
